# Patient Record
Sex: Male | Born: 2000 | Race: Black or African American | Hispanic: No | Marital: Single
Health system: Southern US, Community
[De-identification: ages and names within clinical notes are randomized; demographics above are authoritative.]

## PROBLEM LIST (undated history)

## (undated) DIAGNOSIS — F909 Attention-deficit hyperactivity disorder, unspecified type: Secondary | ICD-10-CM

---
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
## 2020-02-26 ENCOUNTER — Other Ambulatory Visit: Payer: Self-pay

## 2020-02-26 ENCOUNTER — Encounter (HOSPITAL_COMMUNITY): Payer: Self-pay

## 2020-02-26 ENCOUNTER — Emergency Department (HOSPITAL_COMMUNITY): Payer: Medicaid Other

## 2020-02-26 ENCOUNTER — Emergency Department (HOSPITAL_COMMUNITY)
Admission: EM | Admit: 2020-02-26 | Discharge: 2020-02-26 | Disposition: A | Discharge status: Home / Self Care | Payer: Medicaid Other | Attending: Emergency Medicine | Admitting: Emergency Medicine

## 2020-02-26 DIAGNOSIS — Z5321 Procedure and treatment not carried out due to patient leaving prior to being seen by health care provider: Secondary | ICD-10-CM | POA: Insufficient documentation

## 2020-02-26 DIAGNOSIS — I10 Essential (primary) hypertension: Secondary | ICD-10-CM | POA: Diagnosis not present

## 2020-02-26 DIAGNOSIS — S60940A Unspecified superficial injury of right index finger, initial encounter: Secondary | ICD-10-CM | POA: Insufficient documentation

## 2020-02-26 DIAGNOSIS — W230XXA Caught, crushed, jammed, or pinched between moving objects, initial encounter: Secondary | ICD-10-CM | POA: Diagnosis not present

## 2020-02-26 NOTE — ED Notes (Signed)
Registration reported that pt left 

## 2020-02-26 NOTE — ED Triage Notes (Signed)
Pt presents with c/o right index finger injury. Pt reports that he slammed his finger in a car door last night. Finger is swollen and red. Pt is also hypertensive in triage, hx of same.

## 2020-02-26 NOTE — ED Notes (Signed)
Pt did not want vitals taken.
